# Patient Record
Sex: Male | Born: 1995
Health system: Southern US, Community
[De-identification: ages and names within clinical notes are randomized; demographics above are authoritative.]

---
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
## 2017-11-22 DIAGNOSIS — S76311D Strain of muscle, fascia and tendon of the posterior muscle group at thigh level, right thigh, subsequent encounter: Secondary | ICD-10-CM | POA: Diagnosis not present

## 2017-11-26 DIAGNOSIS — S76311D Strain of muscle, fascia and tendon of the posterior muscle group at thigh level, right thigh, subsequent encounter: Secondary | ICD-10-CM | POA: Diagnosis not present

## 2017-11-27 DIAGNOSIS — S76311D Strain of muscle, fascia and tendon of the posterior muscle group at thigh level, right thigh, subsequent encounter: Secondary | ICD-10-CM | POA: Diagnosis not present

## 2017-11-28 DIAGNOSIS — S76311D Strain of muscle, fascia and tendon of the posterior muscle group at thigh level, right thigh, subsequent encounter: Secondary | ICD-10-CM | POA: Diagnosis not present

## 2017-11-29 DIAGNOSIS — S76311D Strain of muscle, fascia and tendon of the posterior muscle group at thigh level, right thigh, subsequent encounter: Secondary | ICD-10-CM | POA: Diagnosis not present

## 2017-12-09 DIAGNOSIS — S76311D Strain of muscle, fascia and tendon of the posterior muscle group at thigh level, right thigh, subsequent encounter: Secondary | ICD-10-CM | POA: Diagnosis not present

## 2017-12-11 DIAGNOSIS — S76311D Strain of muscle, fascia and tendon of the posterior muscle group at thigh level, right thigh, subsequent encounter: Secondary | ICD-10-CM | POA: Diagnosis not present

## 2017-12-12 DIAGNOSIS — S76311D Strain of muscle, fascia and tendon of the posterior muscle group at thigh level, right thigh, subsequent encounter: Secondary | ICD-10-CM | POA: Diagnosis not present

## 2017-12-13 DIAGNOSIS — S76311D Strain of muscle, fascia and tendon of the posterior muscle group at thigh level, right thigh, subsequent encounter: Secondary | ICD-10-CM | POA: Diagnosis not present

## 2017-12-17 DIAGNOSIS — S76311D Strain of muscle, fascia and tendon of the posterior muscle group at thigh level, right thigh, subsequent encounter: Secondary | ICD-10-CM | POA: Diagnosis not present

## 2017-12-18 DIAGNOSIS — S76311D Strain of muscle, fascia and tendon of the posterior muscle group at thigh level, right thigh, subsequent encounter: Secondary | ICD-10-CM | POA: Diagnosis not present

## 2017-12-19 DIAGNOSIS — S76311D Strain of muscle, fascia and tendon of the posterior muscle group at thigh level, right thigh, subsequent encounter: Secondary | ICD-10-CM | POA: Diagnosis not present

## 2017-12-21 DIAGNOSIS — S76311D Strain of muscle, fascia and tendon of the posterior muscle group at thigh level, right thigh, subsequent encounter: Secondary | ICD-10-CM | POA: Diagnosis not present

## 2018-01-02 DIAGNOSIS — S76311D Strain of muscle, fascia and tendon of the posterior muscle group at thigh level, right thigh, subsequent encounter: Secondary | ICD-10-CM | POA: Diagnosis not present

## 2018-01-03 DIAGNOSIS — S76311D Strain of muscle, fascia and tendon of the posterior muscle group at thigh level, right thigh, subsequent encounter: Secondary | ICD-10-CM | POA: Diagnosis not present

## 2018-02-04 DIAGNOSIS — H52203 Unspecified astigmatism, bilateral: Secondary | ICD-10-CM | POA: Diagnosis not present

## 2018-03-11 DIAGNOSIS — M25511 Pain in right shoulder: Secondary | ICD-10-CM | POA: Diagnosis not present

## 2018-04-28 DIAGNOSIS — S43025A Posterior dislocation of left humerus, initial encounter: Secondary | ICD-10-CM | POA: Diagnosis not present

## 2018-05-08 DIAGNOSIS — S43025D Posterior dislocation of left humerus, subsequent encounter: Secondary | ICD-10-CM | POA: Diagnosis not present

## 2018-05-09 DIAGNOSIS — S43025D Posterior dislocation of left humerus, subsequent encounter: Secondary | ICD-10-CM | POA: Diagnosis not present

## 2018-05-10 DIAGNOSIS — S43025D Posterior dislocation of left humerus, subsequent encounter: Secondary | ICD-10-CM | POA: Diagnosis not present

## 2018-05-13 DIAGNOSIS — S43025D Posterior dislocation of left humerus, subsequent encounter: Secondary | ICD-10-CM | POA: Diagnosis not present

## 2018-05-14 DIAGNOSIS — S43025D Posterior dislocation of left humerus, subsequent encounter: Secondary | ICD-10-CM | POA: Diagnosis not present

## 2018-05-27 DIAGNOSIS — S43025D Posterior dislocation of left humerus, subsequent encounter: Secondary | ICD-10-CM | POA: Diagnosis not present

## 2018-05-27 DIAGNOSIS — M7631 Iliotibial band syndrome, right leg: Secondary | ICD-10-CM | POA: Diagnosis not present

## 2018-05-28 DIAGNOSIS — S43025D Posterior dislocation of left humerus, subsequent encounter: Secondary | ICD-10-CM | POA: Diagnosis not present

## 2018-05-29 DIAGNOSIS — M7631 Iliotibial band syndrome, right leg: Secondary | ICD-10-CM | POA: Diagnosis not present

## 2018-06-01 DIAGNOSIS — M7631 Iliotibial band syndrome, right leg: Secondary | ICD-10-CM | POA: Diagnosis not present

## 2018-06-02 ENCOUNTER — Ambulatory Visit
Admission: RE | Admit: 2018-06-02 | Discharge: 2018-06-02 | Disposition: A | Discharge status: Home / Self Care | Payer: BLUE CROSS/BLUE SHIELD | Source: Ambulatory Visit | Attending: Family Medicine | Admitting: Family Medicine

## 2018-06-02 ENCOUNTER — Other Ambulatory Visit: Payer: Self-pay | Admitting: Family Medicine

## 2018-06-02 DIAGNOSIS — R52 Pain, unspecified: Secondary | ICD-10-CM

## 2018-06-02 DIAGNOSIS — M25561 Pain in right knee: Secondary | ICD-10-CM | POA: Insufficient documentation

## 2018-06-02 DIAGNOSIS — R609 Edema, unspecified: Secondary | ICD-10-CM

## 2018-06-02 DIAGNOSIS — M7989 Other specified soft tissue disorders: Secondary | ICD-10-CM | POA: Insufficient documentation

## 2018-06-04 DIAGNOSIS — M7631 Iliotibial band syndrome, right leg: Secondary | ICD-10-CM | POA: Diagnosis not present

## 2018-06-05 DIAGNOSIS — M7631 Iliotibial band syndrome, right leg: Secondary | ICD-10-CM | POA: Diagnosis not present

## 2018-06-08 DIAGNOSIS — S43025D Posterior dislocation of left humerus, subsequent encounter: Secondary | ICD-10-CM | POA: Diagnosis not present

## 2018-06-09 DIAGNOSIS — S43025D Posterior dislocation of left humerus, subsequent encounter: Secondary | ICD-10-CM | POA: Diagnosis not present

## 2018-06-10 DIAGNOSIS — S43025D Posterior dislocation of left humerus, subsequent encounter: Secondary | ICD-10-CM | POA: Diagnosis not present

## 2018-06-11 DIAGNOSIS — M7631 Iliotibial band syndrome, right leg: Secondary | ICD-10-CM | POA: Diagnosis not present

## 2018-06-11 DIAGNOSIS — S43025D Posterior dislocation of left humerus, subsequent encounter: Secondary | ICD-10-CM | POA: Diagnosis not present

## 2018-06-15 DIAGNOSIS — S43025D Posterior dislocation of left humerus, subsequent encounter: Secondary | ICD-10-CM | POA: Diagnosis not present

## 2018-06-16 DIAGNOSIS — S43025D Posterior dislocation of left humerus, subsequent encounter: Secondary | ICD-10-CM | POA: Diagnosis not present

## 2018-06-18 DIAGNOSIS — S43025D Posterior dislocation of left humerus, subsequent encounter: Secondary | ICD-10-CM | POA: Diagnosis not present

## 2018-07-28 DIAGNOSIS — S43025D Posterior dislocation of left humerus, subsequent encounter: Secondary | ICD-10-CM | POA: Diagnosis not present

## 2018-08-13 ENCOUNTER — Other Ambulatory Visit: Payer: Self-pay | Admitting: Family Medicine

## 2018-08-13 ENCOUNTER — Ambulatory Visit
Admission: RE | Admit: 2018-08-13 | Discharge: 2018-08-13 | Disposition: A | Discharge status: Home / Self Care | Payer: BLUE CROSS/BLUE SHIELD | Source: Ambulatory Visit | Attending: Family Medicine | Admitting: Family Medicine

## 2018-08-13 DIAGNOSIS — R52 Pain, unspecified: Secondary | ICD-10-CM

## 2018-08-13 DIAGNOSIS — M25511 Pain in right shoulder: Secondary | ICD-10-CM | POA: Insufficient documentation

## 2018-08-13 DIAGNOSIS — S43004A Unspecified dislocation of right shoulder joint, initial encounter: Secondary | ICD-10-CM | POA: Diagnosis not present

## 2018-08-15 ENCOUNTER — Ambulatory Visit (INDEPENDENT_AMBULATORY_CARE_PROVIDER_SITE_OTHER): Payer: PRIVATE HEALTH INSURANCE | Admitting: Family Medicine

## 2018-08-15 ENCOUNTER — Encounter: Payer: Self-pay | Admitting: Family Medicine

## 2018-08-15 DIAGNOSIS — M25511 Pain in right shoulder: Secondary | ICD-10-CM

## 2018-08-15 NOTE — Progress Notes (Signed)
Patient presents today with history of right shoulder subluxation.  Patient states that the first time was during spring football. It happened again twice over the football season.  He did wear a sully which he states helped. Yesterday he had a subluxation event while in bed rolling over. He states most of his soreness is posterior.  He denies having events similar to this in high school.  He is unsure whether he had one subluxation event during the season to his left shoulder.  The athletic trainers said it was an [facility].  He denies any problems in the weight room.  He rates his pain a 1 out of 10 today.  ROS: Negative except mentioned above. Vitals as per Epic. GENERAL: NAD MSK: R Shoulder -no gross abnormality, mild tenderness posteriorly, full range of motion, negative lift off, negative empty can, negative O'Brien's, positive apprehension NEURO: CN II-XII grossly intact   A/P: Right shoulder instability - x-rays were done, will proceed with doing MRI, patient is to see Dr. Ardine Engiehl next week, seek medical attention if any acute problems.

## 2018-08-19 ENCOUNTER — Ambulatory Visit
Admission: RE | Admit: 2018-08-19 | Discharge: 2018-08-19 | Disposition: A | Discharge status: Home / Self Care | Payer: BLUE CROSS/BLUE SHIELD | Source: Ambulatory Visit | Attending: Family Medicine | Admitting: Family Medicine

## 2018-08-19 DIAGNOSIS — M75111 Incomplete rotator cuff tear or rupture of right shoulder, not specified as traumatic: Secondary | ICD-10-CM | POA: Insufficient documentation

## 2018-08-19 DIAGNOSIS — R609 Edema, unspecified: Secondary | ICD-10-CM | POA: Insufficient documentation

## 2018-08-19 DIAGNOSIS — S43006A Unspecified dislocation of unspecified shoulder joint, initial encounter: Secondary | ICD-10-CM

## 2018-08-19 DIAGNOSIS — X58XXXA Exposure to other specified factors, initial encounter: Secondary | ICD-10-CM | POA: Diagnosis not present

## 2018-08-19 DIAGNOSIS — M25311 Other instability, right shoulder: Secondary | ICD-10-CM | POA: Diagnosis not present

## 2018-08-19 DIAGNOSIS — M25511 Pain in right shoulder: Secondary | ICD-10-CM | POA: Diagnosis not present

## 2018-09-18 DIAGNOSIS — M25319 Other instability, unspecified shoulder: Secondary | ICD-10-CM | POA: Diagnosis not present

## 2018-09-22 DIAGNOSIS — M25311 Other instability, right shoulder: Secondary | ICD-10-CM | POA: Diagnosis not present

## 2018-09-22 DIAGNOSIS — S43431A Superior glenoid labrum lesion of right shoulder, initial encounter: Secondary | ICD-10-CM | POA: Diagnosis not present

## 2018-09-22 DIAGNOSIS — W1839XA Other fall on same level, initial encounter: Secondary | ICD-10-CM | POA: Diagnosis not present

## 2018-09-22 DIAGNOSIS — G8918 Other acute postprocedural pain: Secondary | ICD-10-CM | POA: Diagnosis not present

## 2018-09-22 DIAGNOSIS — M25511 Pain in right shoulder: Secondary | ICD-10-CM | POA: Diagnosis not present

## 2018-09-22 DIAGNOSIS — M65821 Other synovitis and tenosynovitis, right upper arm: Secondary | ICD-10-CM | POA: Diagnosis not present

## 2018-09-25 DIAGNOSIS — S43025D Posterior dislocation of left humerus, subsequent encounter: Secondary | ICD-10-CM | POA: Diagnosis not present

## 2018-09-26 DIAGNOSIS — S43025D Posterior dislocation of left humerus, subsequent encounter: Secondary | ICD-10-CM | POA: Diagnosis not present

## 2018-10-01 DIAGNOSIS — S43025D Posterior dislocation of left humerus, subsequent encounter: Secondary | ICD-10-CM | POA: Diagnosis not present

## 2018-10-02 DIAGNOSIS — S43025D Posterior dislocation of left humerus, subsequent encounter: Secondary | ICD-10-CM | POA: Diagnosis not present

## 2018-10-03 DIAGNOSIS — S43025D Posterior dislocation of left humerus, subsequent encounter: Secondary | ICD-10-CM | POA: Diagnosis not present

## 2018-10-14 DIAGNOSIS — S43025D Posterior dislocation of left humerus, subsequent encounter: Secondary | ICD-10-CM | POA: Diagnosis not present

## 2018-10-15 DIAGNOSIS — S43025D Posterior dislocation of left humerus, subsequent encounter: Secondary | ICD-10-CM | POA: Diagnosis not present

## 2018-10-16 DIAGNOSIS — S43025D Posterior dislocation of left humerus, subsequent encounter: Secondary | ICD-10-CM | POA: Diagnosis not present

## 2018-10-17 DIAGNOSIS — S43025D Posterior dislocation of left humerus, subsequent encounter: Secondary | ICD-10-CM | POA: Diagnosis not present

## 2018-10-20 DIAGNOSIS — S43025D Posterior dislocation of left humerus, subsequent encounter: Secondary | ICD-10-CM | POA: Diagnosis not present

## 2018-10-21 DIAGNOSIS — S43025D Posterior dislocation of left humerus, subsequent encounter: Secondary | ICD-10-CM | POA: Diagnosis not present

## 2018-10-22 DIAGNOSIS — S43025D Posterior dislocation of left humerus, subsequent encounter: Secondary | ICD-10-CM | POA: Diagnosis not present

## 2018-10-23 DIAGNOSIS — S43025D Posterior dislocation of left humerus, subsequent encounter: Secondary | ICD-10-CM | POA: Diagnosis not present

## 2018-10-24 DIAGNOSIS — S43025D Posterior dislocation of left humerus, subsequent encounter: Secondary | ICD-10-CM | POA: Diagnosis not present

## 2018-10-27 DIAGNOSIS — S43025D Posterior dislocation of left humerus, subsequent encounter: Secondary | ICD-10-CM | POA: Diagnosis not present

## 2018-10-28 DIAGNOSIS — S43025D Posterior dislocation of left humerus, subsequent encounter: Secondary | ICD-10-CM | POA: Diagnosis not present

## 2018-10-29 DIAGNOSIS — S43025D Posterior dislocation of left humerus, subsequent encounter: Secondary | ICD-10-CM | POA: Diagnosis not present

## 2018-10-30 DIAGNOSIS — S43025D Posterior dislocation of left humerus, subsequent encounter: Secondary | ICD-10-CM | POA: Diagnosis not present

## 2018-10-31 DIAGNOSIS — S43025D Posterior dislocation of left humerus, subsequent encounter: Secondary | ICD-10-CM | POA: Diagnosis not present

## 2018-11-03 DIAGNOSIS — S43025D Posterior dislocation of left humerus, subsequent encounter: Secondary | ICD-10-CM | POA: Diagnosis not present

## 2018-11-04 DIAGNOSIS — S43025D Posterior dislocation of left humerus, subsequent encounter: Secondary | ICD-10-CM | POA: Diagnosis not present

## 2018-11-05 DIAGNOSIS — S43025D Posterior dislocation of left humerus, subsequent encounter: Secondary | ICD-10-CM | POA: Diagnosis not present

## 2018-11-06 DIAGNOSIS — S43025D Posterior dislocation of left humerus, subsequent encounter: Secondary | ICD-10-CM | POA: Diagnosis not present

## 2018-11-07 DIAGNOSIS — S43025D Posterior dislocation of left humerus, subsequent encounter: Secondary | ICD-10-CM | POA: Diagnosis not present

## 2018-11-10 DIAGNOSIS — S43025D Posterior dislocation of left humerus, subsequent encounter: Secondary | ICD-10-CM | POA: Diagnosis not present

## 2018-11-11 DIAGNOSIS — S43025D Posterior dislocation of left humerus, subsequent encounter: Secondary | ICD-10-CM | POA: Diagnosis not present

## 2018-11-11 DIAGNOSIS — S43431D Superior glenoid labrum lesion of right shoulder, subsequent encounter: Secondary | ICD-10-CM | POA: Diagnosis not present

## 2018-11-12 DIAGNOSIS — S43025D Posterior dislocation of left humerus, subsequent encounter: Secondary | ICD-10-CM | POA: Diagnosis not present

## 2018-11-13 DIAGNOSIS — S43025D Posterior dislocation of left humerus, subsequent encounter: Secondary | ICD-10-CM | POA: Diagnosis not present

## 2018-11-15 DIAGNOSIS — S43025D Posterior dislocation of left humerus, subsequent encounter: Secondary | ICD-10-CM | POA: Diagnosis not present

## 2019-02-04 ENCOUNTER — Other Ambulatory Visit: Payer: Self-pay | Admitting: Hematology

## 2019-02-04 DIAGNOSIS — Z20822 Contact with and (suspected) exposure to covid-19: Secondary | ICD-10-CM

## 2019-02-04 NOTE — Progress Notes (Signed)
COVID screening order.  Please send results to Dr. Jolene Provost (listed as PCP).  Patient is a Consulting civil engineer at [employer]

## 2019-02-05 ENCOUNTER — Other Ambulatory Visit: Payer: PRIVATE HEALTH INSURANCE

## 2019-02-06 ENCOUNTER — Other Ambulatory Visit: Payer: Self-pay

## 2019-02-06 DIAGNOSIS — Z20822 Contact with and (suspected) exposure to covid-19: Secondary | ICD-10-CM

## 2019-02-08 LAB — NOVEL CORONAVIRUS, NAA: SARS-CoV-2, NAA: NOT DETECTED

## 2019-02-09 DIAGNOSIS — S43025D Posterior dislocation of left humerus, subsequent encounter: Secondary | ICD-10-CM | POA: Diagnosis not present

## 2019-02-10 DIAGNOSIS — S43025D Posterior dislocation of left humerus, subsequent encounter: Secondary | ICD-10-CM | POA: Diagnosis not present

## 2019-02-17 DIAGNOSIS — S43025D Posterior dislocation of left humerus, subsequent encounter: Secondary | ICD-10-CM | POA: Diagnosis not present

## 2019-02-19 DIAGNOSIS — S43025D Posterior dislocation of left humerus, subsequent encounter: Secondary | ICD-10-CM | POA: Diagnosis not present

## 2019-02-23 DIAGNOSIS — S43025D Posterior dislocation of left humerus, subsequent encounter: Secondary | ICD-10-CM | POA: Diagnosis not present

## 2019-02-25 DIAGNOSIS — S43025D Posterior dislocation of left humerus, subsequent encounter: Secondary | ICD-10-CM | POA: Diagnosis not present

## 2019-02-26 DIAGNOSIS — S43025D Posterior dislocation of left humerus, subsequent encounter: Secondary | ICD-10-CM | POA: Diagnosis not present

## 2019-02-27 DIAGNOSIS — S43025D Posterior dislocation of left humerus, subsequent encounter: Secondary | ICD-10-CM | POA: Diagnosis not present

## 2019-03-04 DIAGNOSIS — S43025D Posterior dislocation of left humerus, subsequent encounter: Secondary | ICD-10-CM | POA: Diagnosis not present

## 2019-03-05 DIAGNOSIS — S43025D Posterior dislocation of left humerus, subsequent encounter: Secondary | ICD-10-CM | POA: Diagnosis not present

## 2019-03-06 DIAGNOSIS — S43025D Posterior dislocation of left humerus, subsequent encounter: Secondary | ICD-10-CM | POA: Diagnosis not present

## 2019-03-12 ENCOUNTER — Other Ambulatory Visit: Payer: Self-pay | Admitting: *Deleted

## 2019-03-12 DIAGNOSIS — Z20822 Contact with and (suspected) exposure to covid-19: Secondary | ICD-10-CM

## 2019-03-16 ENCOUNTER — Other Ambulatory Visit: Payer: Self-pay

## 2019-03-16 DIAGNOSIS — Z20822 Contact with and (suspected) exposure to covid-19: Secondary | ICD-10-CM

## 2019-03-16 DIAGNOSIS — R6889 Other general symptoms and signs: Secondary | ICD-10-CM | POA: Diagnosis not present

## 2019-03-21 LAB — NOVEL CORONAVIRUS, NAA: SARS-CoV-2, NAA: NOT DETECTED

## 2019-03-25 DIAGNOSIS — S43025D Posterior dislocation of left humerus, subsequent encounter: Secondary | ICD-10-CM | POA: Diagnosis not present

## 2019-03-27 DIAGNOSIS — S43025D Posterior dislocation of left humerus, subsequent encounter: Secondary | ICD-10-CM | POA: Diagnosis not present

## 2019-04-02 DIAGNOSIS — S43025D Posterior dislocation of left humerus, subsequent encounter: Secondary | ICD-10-CM | POA: Diagnosis not present

## 2019-04-06 ENCOUNTER — Other Ambulatory Visit: Payer: Self-pay

## 2019-04-06 DIAGNOSIS — Z20822 Contact with and (suspected) exposure to covid-19: Secondary | ICD-10-CM

## 2019-04-07 DIAGNOSIS — R6889 Other general symptoms and signs: Secondary | ICD-10-CM | POA: Diagnosis not present

## 2019-04-08 DIAGNOSIS — S43025D Posterior dislocation of left humerus, subsequent encounter: Secondary | ICD-10-CM | POA: Diagnosis not present

## 2019-04-08 DIAGNOSIS — M25311 Other instability, right shoulder: Secondary | ICD-10-CM | POA: Diagnosis not present

## 2019-04-09 LAB — NOVEL CORONAVIRUS, NAA: SARS-CoV-2, NAA: NOT DETECTED

## 2019-04-10 DIAGNOSIS — S43025D Posterior dislocation of left humerus, subsequent encounter: Secondary | ICD-10-CM | POA: Diagnosis not present

## 2019-04-16 DIAGNOSIS — S43025D Posterior dislocation of left humerus, subsequent encounter: Secondary | ICD-10-CM | POA: Diagnosis not present

## 2019-04-21 DIAGNOSIS — S43025D Posterior dislocation of left humerus, subsequent encounter: Secondary | ICD-10-CM | POA: Diagnosis not present

## 2019-04-23 DIAGNOSIS — S43025D Posterior dislocation of left humerus, subsequent encounter: Secondary | ICD-10-CM | POA: Diagnosis not present

## 2019-04-27 DIAGNOSIS — S43025D Posterior dislocation of left humerus, subsequent encounter: Secondary | ICD-10-CM | POA: Diagnosis not present

## 2019-05-27 DIAGNOSIS — Z20828 Contact with and (suspected) exposure to other viral communicable diseases: Secondary | ICD-10-CM | POA: Diagnosis not present

## 2019-06-03 DIAGNOSIS — Z20828 Contact with and (suspected) exposure to other viral communicable diseases: Secondary | ICD-10-CM | POA: Diagnosis not present

## 2019-06-15 DIAGNOSIS — S43025D Posterior dislocation of left humerus, subsequent encounter: Secondary | ICD-10-CM | POA: Diagnosis not present

## 2019-06-25 DIAGNOSIS — S43025D Posterior dislocation of left humerus, subsequent encounter: Secondary | ICD-10-CM | POA: Diagnosis not present

## 2019-07-07 ENCOUNTER — Other Ambulatory Visit: Payer: Self-pay | Admitting: Family Medicine

## 2019-07-07 DIAGNOSIS — S43006A Unspecified dislocation of unspecified shoulder joint, initial encounter: Secondary | ICD-10-CM

## 2019-07-07 DIAGNOSIS — S43005A Unspecified dislocation of left shoulder joint, initial encounter: Secondary | ICD-10-CM

## 2019-07-08 ENCOUNTER — Other Ambulatory Visit: Payer: Self-pay

## 2019-07-08 ENCOUNTER — Ambulatory Visit
Admission: RE | Admit: 2019-07-08 | Discharge: 2019-07-08 | Disposition: A | Discharge status: Home / Self Care | Payer: BC Managed Care – PPO | Source: Ambulatory Visit | Attending: Family Medicine | Admitting: Family Medicine

## 2019-07-08 ENCOUNTER — Ambulatory Visit
Admission: RE | Admit: 2019-07-08 | Discharge: 2019-07-08 | Disposition: A | Discharge status: Home / Self Care | Payer: BC Managed Care – PPO | Attending: Family Medicine | Admitting: Family Medicine

## 2019-07-08 DIAGNOSIS — S43005A Unspecified dislocation of left shoulder joint, initial encounter: Secondary | ICD-10-CM | POA: Diagnosis not present

## 2019-07-08 DIAGNOSIS — Z20828 Contact with and (suspected) exposure to other viral communicable diseases: Secondary | ICD-10-CM | POA: Diagnosis not present

## 2019-07-08 DIAGNOSIS — S43015A Anterior dislocation of left humerus, initial encounter: Secondary | ICD-10-CM | POA: Diagnosis not present

## 2019-07-10 ENCOUNTER — Other Ambulatory Visit: Payer: Self-pay

## 2019-07-10 ENCOUNTER — Ambulatory Visit
Admission: RE | Admit: 2019-07-10 | Discharge: 2019-07-10 | Disposition: A | Discharge status: Home / Self Care | Payer: BC Managed Care – PPO | Source: Ambulatory Visit | Attending: Family Medicine | Admitting: Family Medicine

## 2019-07-10 DIAGNOSIS — S43006A Unspecified dislocation of unspecified shoulder joint, initial encounter: Secondary | ICD-10-CM | POA: Diagnosis not present

## 2019-07-10 DIAGNOSIS — S43005A Unspecified dislocation of left shoulder joint, initial encounter: Secondary | ICD-10-CM | POA: Diagnosis not present

## 2019-07-13 DIAGNOSIS — M25312 Other instability, left shoulder: Secondary | ICD-10-CM | POA: Diagnosis not present

## 2019-07-20 DIAGNOSIS — Z20828 Contact with and (suspected) exposure to other viral communicable diseases: Secondary | ICD-10-CM | POA: Diagnosis not present

## 2019-07-23 DIAGNOSIS — M948X1 Other specified disorders of cartilage, shoulder: Secondary | ICD-10-CM | POA: Diagnosis not present

## 2019-07-23 DIAGNOSIS — G8918 Other acute postprocedural pain: Secondary | ICD-10-CM | POA: Diagnosis not present

## 2019-07-23 DIAGNOSIS — M25312 Other instability, left shoulder: Secondary | ICD-10-CM | POA: Diagnosis not present

## 2019-07-23 DIAGNOSIS — M25512 Pain in left shoulder: Secondary | ICD-10-CM | POA: Diagnosis not present

## 2019-07-23 DIAGNOSIS — M75112 Incomplete rotator cuff tear or rupture of left shoulder, not specified as traumatic: Secondary | ICD-10-CM | POA: Diagnosis not present

## 2019-07-23 DIAGNOSIS — M24012 Loose body in left shoulder: Secondary | ICD-10-CM | POA: Diagnosis not present

## 2019-07-23 DIAGNOSIS — M24112 Other articular cartilage disorders, left shoulder: Secondary | ICD-10-CM | POA: Diagnosis not present

## 2019-07-27 DIAGNOSIS — S43432D Superior glenoid labrum lesion of left shoulder, subsequent encounter: Secondary | ICD-10-CM | POA: Diagnosis not present

## 2019-07-27 DIAGNOSIS — S43025D Posterior dislocation of left humerus, subsequent encounter: Secondary | ICD-10-CM | POA: Diagnosis not present

## 2019-07-28 DIAGNOSIS — S43025D Posterior dislocation of left humerus, subsequent encounter: Secondary | ICD-10-CM | POA: Diagnosis not present

## 2019-07-30 DIAGNOSIS — S43025D Posterior dislocation of left humerus, subsequent encounter: Secondary | ICD-10-CM | POA: Diagnosis not present

## 2019-08-10 DIAGNOSIS — S43025D Posterior dislocation of left humerus, subsequent encounter: Secondary | ICD-10-CM | POA: Diagnosis not present

## 2019-08-11 DIAGNOSIS — S43025D Posterior dislocation of left humerus, subsequent encounter: Secondary | ICD-10-CM | POA: Diagnosis not present

## 2019-08-12 DIAGNOSIS — S43025D Posterior dislocation of left humerus, subsequent encounter: Secondary | ICD-10-CM | POA: Diagnosis not present

## 2019-08-13 DIAGNOSIS — S43025D Posterior dislocation of left humerus, subsequent encounter: Secondary | ICD-10-CM | POA: Diagnosis not present

## 2019-08-17 DIAGNOSIS — S43025D Posterior dislocation of left humerus, subsequent encounter: Secondary | ICD-10-CM | POA: Diagnosis not present

## 2019-08-18 DIAGNOSIS — S43025D Posterior dislocation of left humerus, subsequent encounter: Secondary | ICD-10-CM | POA: Diagnosis not present

## 2019-08-20 DIAGNOSIS — S43025D Posterior dislocation of left humerus, subsequent encounter: Secondary | ICD-10-CM | POA: Diagnosis not present

## 2019-08-21 DIAGNOSIS — S43025D Posterior dislocation of left humerus, subsequent encounter: Secondary | ICD-10-CM | POA: Diagnosis not present

## 2019-08-24 DIAGNOSIS — S43025D Posterior dislocation of left humerus, subsequent encounter: Secondary | ICD-10-CM | POA: Diagnosis not present

## 2019-09-05 DIAGNOSIS — Z20828 Contact with and (suspected) exposure to other viral communicable diseases: Secondary | ICD-10-CM | POA: Diagnosis not present

## 2019-09-05 DIAGNOSIS — J039 Acute tonsillitis, unspecified: Secondary | ICD-10-CM | POA: Diagnosis not present

## 2019-09-05 DIAGNOSIS — J029 Acute pharyngitis, unspecified: Secondary | ICD-10-CM | POA: Diagnosis not present

## 2019-09-16 DIAGNOSIS — Z20822 Contact with and (suspected) exposure to covid-19: Secondary | ICD-10-CM | POA: Diagnosis not present

## 2019-09-17 DIAGNOSIS — S43025D Posterior dislocation of left humerus, subsequent encounter: Secondary | ICD-10-CM | POA: Diagnosis not present

## 2019-09-18 DIAGNOSIS — S43025D Posterior dislocation of left humerus, subsequent encounter: Secondary | ICD-10-CM | POA: Diagnosis not present

## 2019-09-19 DIAGNOSIS — S43025D Posterior dislocation of left humerus, subsequent encounter: Secondary | ICD-10-CM | POA: Diagnosis not present

## 2019-09-20 DIAGNOSIS — Z20822 Contact with and (suspected) exposure to covid-19: Secondary | ICD-10-CM | POA: Diagnosis not present

## 2019-09-21 DIAGNOSIS — S43025D Posterior dislocation of left humerus, subsequent encounter: Secondary | ICD-10-CM | POA: Diagnosis not present

## 2019-09-23 DIAGNOSIS — S43025D Posterior dislocation of left humerus, subsequent encounter: Secondary | ICD-10-CM | POA: Diagnosis not present

## 2019-09-24 DIAGNOSIS — S43025D Posterior dislocation of left humerus, subsequent encounter: Secondary | ICD-10-CM | POA: Diagnosis not present

## 2019-09-29 DIAGNOSIS — S43025D Posterior dislocation of left humerus, subsequent encounter: Secondary | ICD-10-CM | POA: Diagnosis not present

## 2019-10-02 DIAGNOSIS — Z20822 Contact with and (suspected) exposure to covid-19: Secondary | ICD-10-CM | POA: Diagnosis not present

## 2019-10-04 DIAGNOSIS — Z20822 Contact with and (suspected) exposure to covid-19: Secondary | ICD-10-CM | POA: Diagnosis not present

## 2019-10-06 DIAGNOSIS — S43025D Posterior dislocation of left humerus, subsequent encounter: Secondary | ICD-10-CM | POA: Diagnosis not present

## 2019-10-06 DIAGNOSIS — Z20822 Contact with and (suspected) exposure to covid-19: Secondary | ICD-10-CM | POA: Diagnosis not present

## 2019-10-07 DIAGNOSIS — S43025D Posterior dislocation of left humerus, subsequent encounter: Secondary | ICD-10-CM | POA: Diagnosis not present

## 2019-10-08 DIAGNOSIS — Z20822 Contact with and (suspected) exposure to covid-19: Secondary | ICD-10-CM | POA: Diagnosis not present

## 2019-10-10 DIAGNOSIS — S43025D Posterior dislocation of left humerus, subsequent encounter: Secondary | ICD-10-CM | POA: Diagnosis not present

## 2019-10-11 DIAGNOSIS — Z20822 Contact with and (suspected) exposure to covid-19: Secondary | ICD-10-CM | POA: Diagnosis not present

## 2019-10-13 DIAGNOSIS — S43025D Posterior dislocation of left humerus, subsequent encounter: Secondary | ICD-10-CM | POA: Diagnosis not present

## 2019-10-13 DIAGNOSIS — Z20822 Contact with and (suspected) exposure to covid-19: Secondary | ICD-10-CM | POA: Diagnosis not present

## 2019-10-14 DIAGNOSIS — S43025D Posterior dislocation of left humerus, subsequent encounter: Secondary | ICD-10-CM | POA: Diagnosis not present

## 2019-10-15 DIAGNOSIS — Z20822 Contact with and (suspected) exposure to covid-19: Secondary | ICD-10-CM | POA: Diagnosis not present

## 2019-10-15 DIAGNOSIS — S43025D Posterior dislocation of left humerus, subsequent encounter: Secondary | ICD-10-CM | POA: Diagnosis not present

## 2019-10-16 DIAGNOSIS — S43025D Posterior dislocation of left humerus, subsequent encounter: Secondary | ICD-10-CM | POA: Diagnosis not present

## 2019-10-18 DIAGNOSIS — S43025D Posterior dislocation of left humerus, subsequent encounter: Secondary | ICD-10-CM | POA: Diagnosis not present

## 2019-10-18 DIAGNOSIS — Z20822 Contact with and (suspected) exposure to covid-19: Secondary | ICD-10-CM | POA: Diagnosis not present

## 2019-10-19 DIAGNOSIS — S43025D Posterior dislocation of left humerus, subsequent encounter: Secondary | ICD-10-CM | POA: Diagnosis not present

## 2019-10-20 DIAGNOSIS — Z20822 Contact with and (suspected) exposure to covid-19: Secondary | ICD-10-CM | POA: Diagnosis not present

## 2019-10-20 DIAGNOSIS — S43025D Posterior dislocation of left humerus, subsequent encounter: Secondary | ICD-10-CM | POA: Diagnosis not present

## 2019-10-22 DIAGNOSIS — Z20822 Contact with and (suspected) exposure to covid-19: Secondary | ICD-10-CM | POA: Diagnosis not present

## 2019-10-22 DIAGNOSIS — S43025D Posterior dislocation of left humerus, subsequent encounter: Secondary | ICD-10-CM | POA: Diagnosis not present

## 2019-10-25 DIAGNOSIS — Z20822 Contact with and (suspected) exposure to covid-19: Secondary | ICD-10-CM | POA: Diagnosis not present

## 2019-10-25 DIAGNOSIS — S43025D Posterior dislocation of left humerus, subsequent encounter: Secondary | ICD-10-CM | POA: Diagnosis not present

## 2019-10-26 DIAGNOSIS — S43025D Posterior dislocation of left humerus, subsequent encounter: Secondary | ICD-10-CM | POA: Diagnosis not present

## 2019-10-27 DIAGNOSIS — Z20822 Contact with and (suspected) exposure to covid-19: Secondary | ICD-10-CM | POA: Diagnosis not present

## 2019-10-28 DIAGNOSIS — S43025D Posterior dislocation of left humerus, subsequent encounter: Secondary | ICD-10-CM | POA: Diagnosis not present

## 2019-10-30 DIAGNOSIS — Z20822 Contact with and (suspected) exposure to covid-19: Secondary | ICD-10-CM | POA: Diagnosis not present

## 2019-11-01 DIAGNOSIS — Z20822 Contact with and (suspected) exposure to covid-19: Secondary | ICD-10-CM | POA: Diagnosis not present

## 2019-11-01 DIAGNOSIS — S43025D Posterior dislocation of left humerus, subsequent encounter: Secondary | ICD-10-CM | POA: Diagnosis not present

## 2019-11-02 DIAGNOSIS — M25312 Other instability, left shoulder: Secondary | ICD-10-CM | POA: Diagnosis not present

## 2019-11-02 DIAGNOSIS — S43025D Posterior dislocation of left humerus, subsequent encounter: Secondary | ICD-10-CM | POA: Diagnosis not present

## 2019-11-03 DIAGNOSIS — Z20822 Contact with and (suspected) exposure to covid-19: Secondary | ICD-10-CM | POA: Diagnosis not present

## 2019-11-03 DIAGNOSIS — S43025D Posterior dislocation of left humerus, subsequent encounter: Secondary | ICD-10-CM | POA: Diagnosis not present

## 2019-11-04 DIAGNOSIS — S43025D Posterior dislocation of left humerus, subsequent encounter: Secondary | ICD-10-CM | POA: Diagnosis not present

## 2019-11-05 DIAGNOSIS — S43025D Posterior dislocation of left humerus, subsequent encounter: Secondary | ICD-10-CM | POA: Diagnosis not present

## 2019-11-06 DIAGNOSIS — Z20822 Contact with and (suspected) exposure to covid-19: Secondary | ICD-10-CM | POA: Diagnosis not present

## 2019-11-08 DIAGNOSIS — Z20822 Contact with and (suspected) exposure to covid-19: Secondary | ICD-10-CM | POA: Diagnosis not present

## 2019-11-08 DIAGNOSIS — S43025D Posterior dislocation of left humerus, subsequent encounter: Secondary | ICD-10-CM | POA: Diagnosis not present

## 2019-11-09 DIAGNOSIS — S43025D Posterior dislocation of left humerus, subsequent encounter: Secondary | ICD-10-CM | POA: Diagnosis not present

## 2019-11-10 DIAGNOSIS — Z20822 Contact with and (suspected) exposure to covid-19: Secondary | ICD-10-CM | POA: Diagnosis not present

## 2019-11-11 DIAGNOSIS — S43025D Posterior dislocation of left humerus, subsequent encounter: Secondary | ICD-10-CM | POA: Diagnosis not present

## 2019-11-12 DIAGNOSIS — S43025D Posterior dislocation of left humerus, subsequent encounter: Secondary | ICD-10-CM | POA: Diagnosis not present

## 2019-11-12 DIAGNOSIS — Z20822 Contact with and (suspected) exposure to covid-19: Secondary | ICD-10-CM | POA: Diagnosis not present

## 2019-11-15 DIAGNOSIS — Z20822 Contact with and (suspected) exposure to covid-19: Secondary | ICD-10-CM | POA: Diagnosis not present

## 2019-11-16 DIAGNOSIS — S43025D Posterior dislocation of left humerus, subsequent encounter: Secondary | ICD-10-CM | POA: Diagnosis not present

## 2019-11-17 DIAGNOSIS — Z20822 Contact with and (suspected) exposure to covid-19: Secondary | ICD-10-CM | POA: Diagnosis not present

## 2019-11-17 DIAGNOSIS — S43025D Posterior dislocation of left humerus, subsequent encounter: Secondary | ICD-10-CM | POA: Diagnosis not present

## 2019-11-18 DIAGNOSIS — S43025D Posterior dislocation of left humerus, subsequent encounter: Secondary | ICD-10-CM | POA: Diagnosis not present

## 2019-11-19 DIAGNOSIS — S43025D Posterior dislocation of left humerus, subsequent encounter: Secondary | ICD-10-CM | POA: Diagnosis not present

## 2019-11-22 DIAGNOSIS — Z20822 Contact with and (suspected) exposure to covid-19: Secondary | ICD-10-CM | POA: Diagnosis not present

## 2019-11-26 DIAGNOSIS — S43025D Posterior dislocation of left humerus, subsequent encounter: Secondary | ICD-10-CM | POA: Diagnosis not present

## 2019-11-26 DIAGNOSIS — Z20822 Contact with and (suspected) exposure to covid-19: Secondary | ICD-10-CM | POA: Diagnosis not present

## 2019-11-27 DIAGNOSIS — Z20822 Contact with and (suspected) exposure to covid-19: Secondary | ICD-10-CM | POA: Diagnosis not present

## 2019-11-27 DIAGNOSIS — S43025D Posterior dislocation of left humerus, subsequent encounter: Secondary | ICD-10-CM | POA: Diagnosis not present

## 2019-11-29 DIAGNOSIS — S43025D Posterior dislocation of left humerus, subsequent encounter: Secondary | ICD-10-CM | POA: Diagnosis not present

## 2019-11-29 DIAGNOSIS — Z20822 Contact with and (suspected) exposure to covid-19: Secondary | ICD-10-CM | POA: Diagnosis not present

## 2019-11-30 DIAGNOSIS — S43025D Posterior dislocation of left humerus, subsequent encounter: Secondary | ICD-10-CM | POA: Diagnosis not present

## 2019-12-01 DIAGNOSIS — S43025D Posterior dislocation of left humerus, subsequent encounter: Secondary | ICD-10-CM | POA: Diagnosis not present

## 2019-12-01 DIAGNOSIS — Z20822 Contact with and (suspected) exposure to covid-19: Secondary | ICD-10-CM | POA: Diagnosis not present

## 2019-12-02 DIAGNOSIS — S43025D Posterior dislocation of left humerus, subsequent encounter: Secondary | ICD-10-CM | POA: Diagnosis not present

## 2019-12-03 DIAGNOSIS — S43025D Posterior dislocation of left humerus, subsequent encounter: Secondary | ICD-10-CM | POA: Diagnosis not present

## 2019-12-04 DIAGNOSIS — Z20822 Contact with and (suspected) exposure to covid-19: Secondary | ICD-10-CM | POA: Diagnosis not present

## 2019-12-06 DIAGNOSIS — Z20822 Contact with and (suspected) exposure to covid-19: Secondary | ICD-10-CM | POA: Diagnosis not present

## 2019-12-07 DIAGNOSIS — S43025D Posterior dislocation of left humerus, subsequent encounter: Secondary | ICD-10-CM | POA: Diagnosis not present

## 2019-12-08 DIAGNOSIS — Z20822 Contact with and (suspected) exposure to covid-19: Secondary | ICD-10-CM | POA: Diagnosis not present

## 2019-12-08 DIAGNOSIS — S43025D Posterior dislocation of left humerus, subsequent encounter: Secondary | ICD-10-CM | POA: Diagnosis not present

## 2019-12-10 DIAGNOSIS — S43025D Posterior dislocation of left humerus, subsequent encounter: Secondary | ICD-10-CM | POA: Diagnosis not present

## 2019-12-10 DIAGNOSIS — Z20822 Contact with and (suspected) exposure to covid-19: Secondary | ICD-10-CM | POA: Diagnosis not present

## 2019-12-15 DIAGNOSIS — Z20822 Contact with and (suspected) exposure to covid-19: Secondary | ICD-10-CM | POA: Diagnosis not present

## 2019-12-17 DIAGNOSIS — S43025D Posterior dislocation of left humerus, subsequent encounter: Secondary | ICD-10-CM | POA: Diagnosis not present

## 2019-12-21 DIAGNOSIS — S43025D Posterior dislocation of left humerus, subsequent encounter: Secondary | ICD-10-CM | POA: Diagnosis not present

## 2019-12-21 DIAGNOSIS — Z20822 Contact with and (suspected) exposure to covid-19: Secondary | ICD-10-CM | POA: Diagnosis not present

## 2019-12-22 DIAGNOSIS — S43025D Posterior dislocation of left humerus, subsequent encounter: Secondary | ICD-10-CM | POA: Diagnosis not present

## 2019-12-23 DIAGNOSIS — S43025D Posterior dislocation of left humerus, subsequent encounter: Secondary | ICD-10-CM | POA: Diagnosis not present

## 2019-12-24 DIAGNOSIS — S43025D Posterior dislocation of left humerus, subsequent encounter: Secondary | ICD-10-CM | POA: Diagnosis not present

## 2019-12-28 DIAGNOSIS — S43025D Posterior dislocation of left humerus, subsequent encounter: Secondary | ICD-10-CM | POA: Diagnosis not present

## 2019-12-29 DIAGNOSIS — Z20822 Contact with and (suspected) exposure to covid-19: Secondary | ICD-10-CM | POA: Diagnosis not present

## 2019-12-31 DIAGNOSIS — S43025D Posterior dislocation of left humerus, subsequent encounter: Secondary | ICD-10-CM | POA: Diagnosis not present

## 2019-12-31 DIAGNOSIS — Z20822 Contact with and (suspected) exposure to covid-19: Secondary | ICD-10-CM | POA: Diagnosis not present

## 2020-01-01 DIAGNOSIS — S43025D Posterior dislocation of left humerus, subsequent encounter: Secondary | ICD-10-CM | POA: Diagnosis not present

## 2020-01-04 DIAGNOSIS — S43025D Posterior dislocation of left humerus, subsequent encounter: Secondary | ICD-10-CM | POA: Diagnosis not present

## 2020-01-05 DIAGNOSIS — Z20822 Contact with and (suspected) exposure to covid-19: Secondary | ICD-10-CM | POA: Diagnosis not present

## 2020-01-06 DIAGNOSIS — S43025D Posterior dislocation of left humerus, subsequent encounter: Secondary | ICD-10-CM | POA: Diagnosis not present

## 2020-01-07 DIAGNOSIS — S43025D Posterior dislocation of left humerus, subsequent encounter: Secondary | ICD-10-CM | POA: Diagnosis not present

## 2021-02-12 IMAGING — CR DG SHOULDER 2+V*L*
1 series · 4 of 4 positions shown · non-contrast
Comparison: None.

CLINICAL DATA: Recent anterior dislocation of the proximal left
humerus, reduced yesterday.

EXAM:
LEFT SHOULDER - 2+ VIEW

[Series 1: dg shoulder left · 0.14mm/px · 4 of 4 slices shown]
[im 1/4]
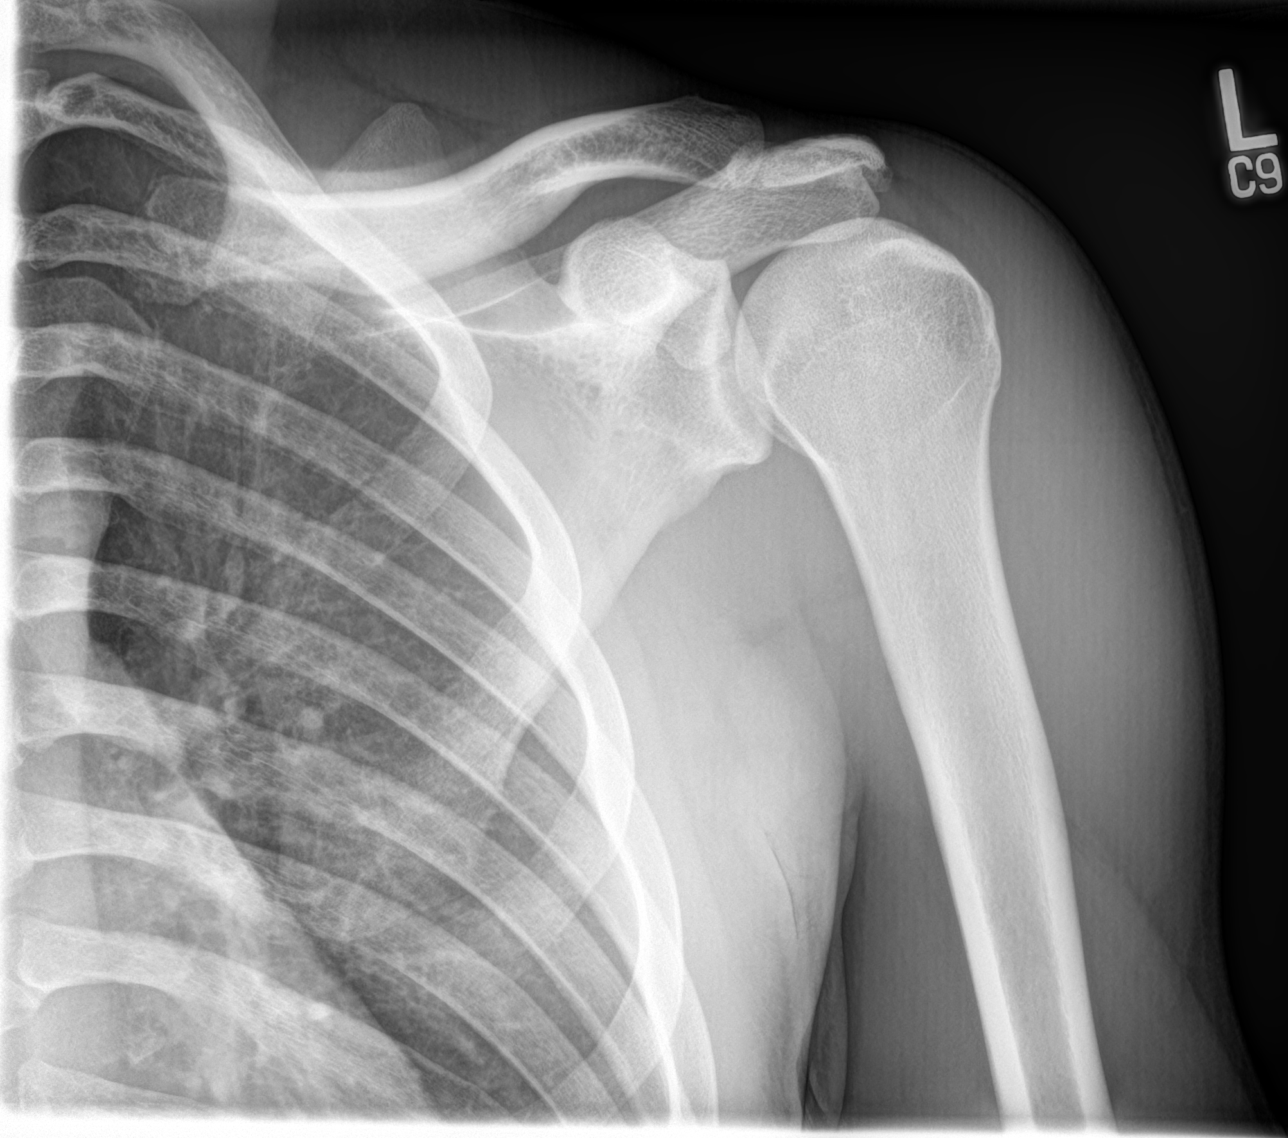
[im 2/4]
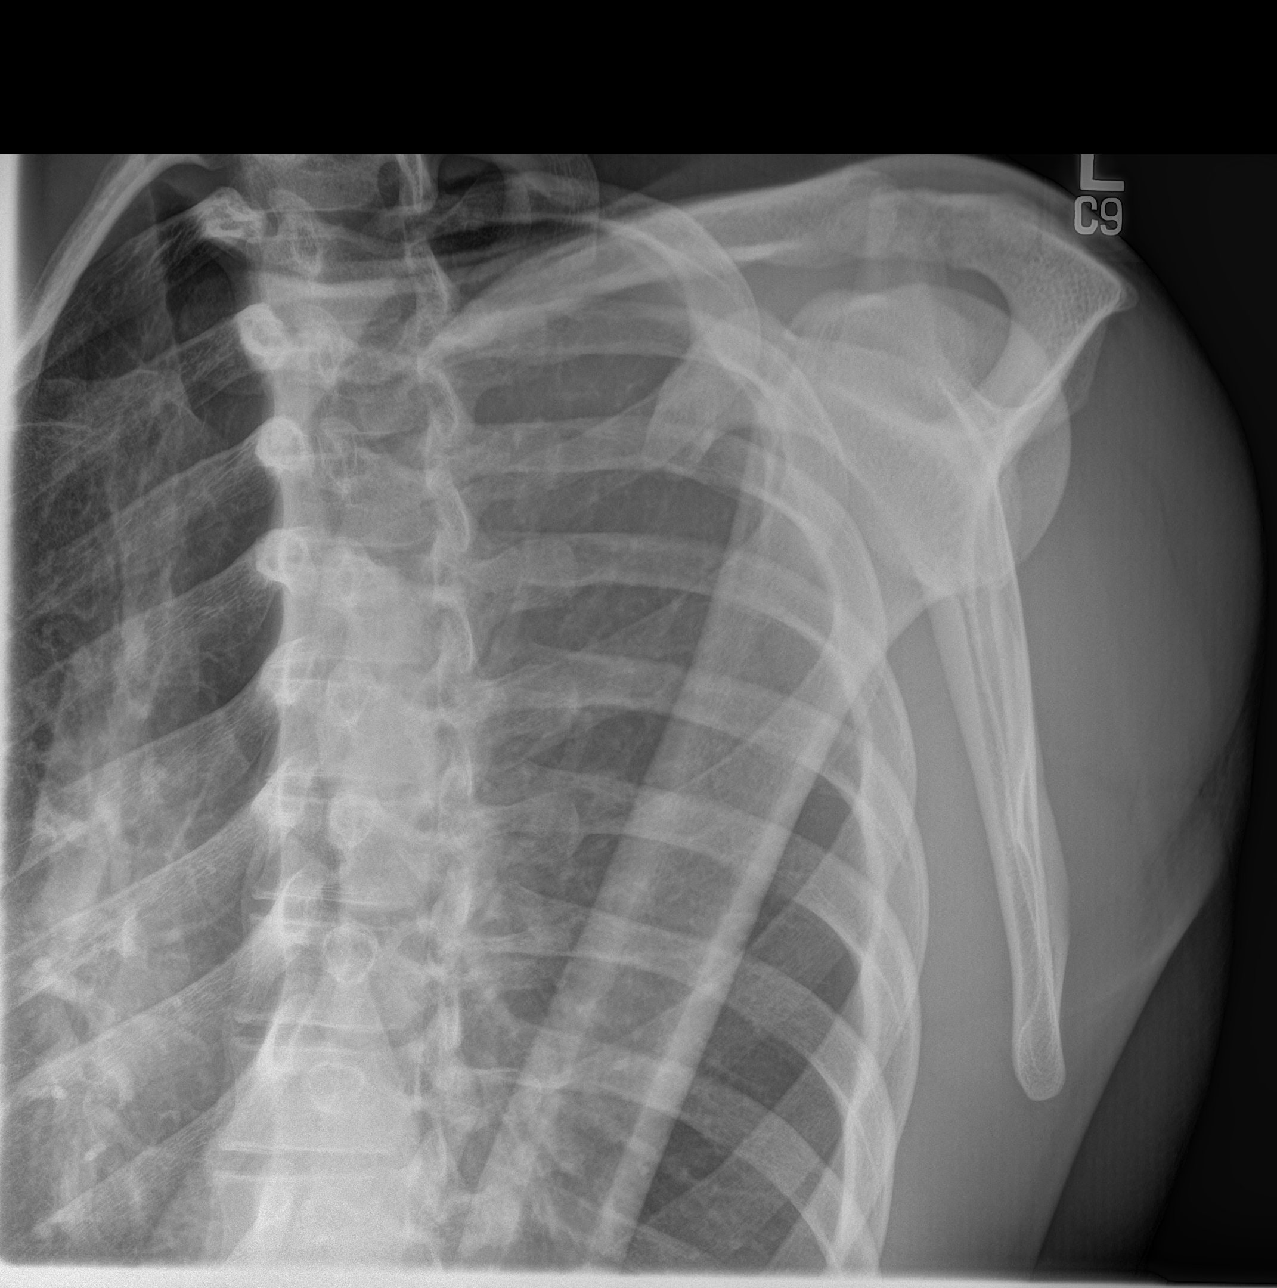
[im 3/4]
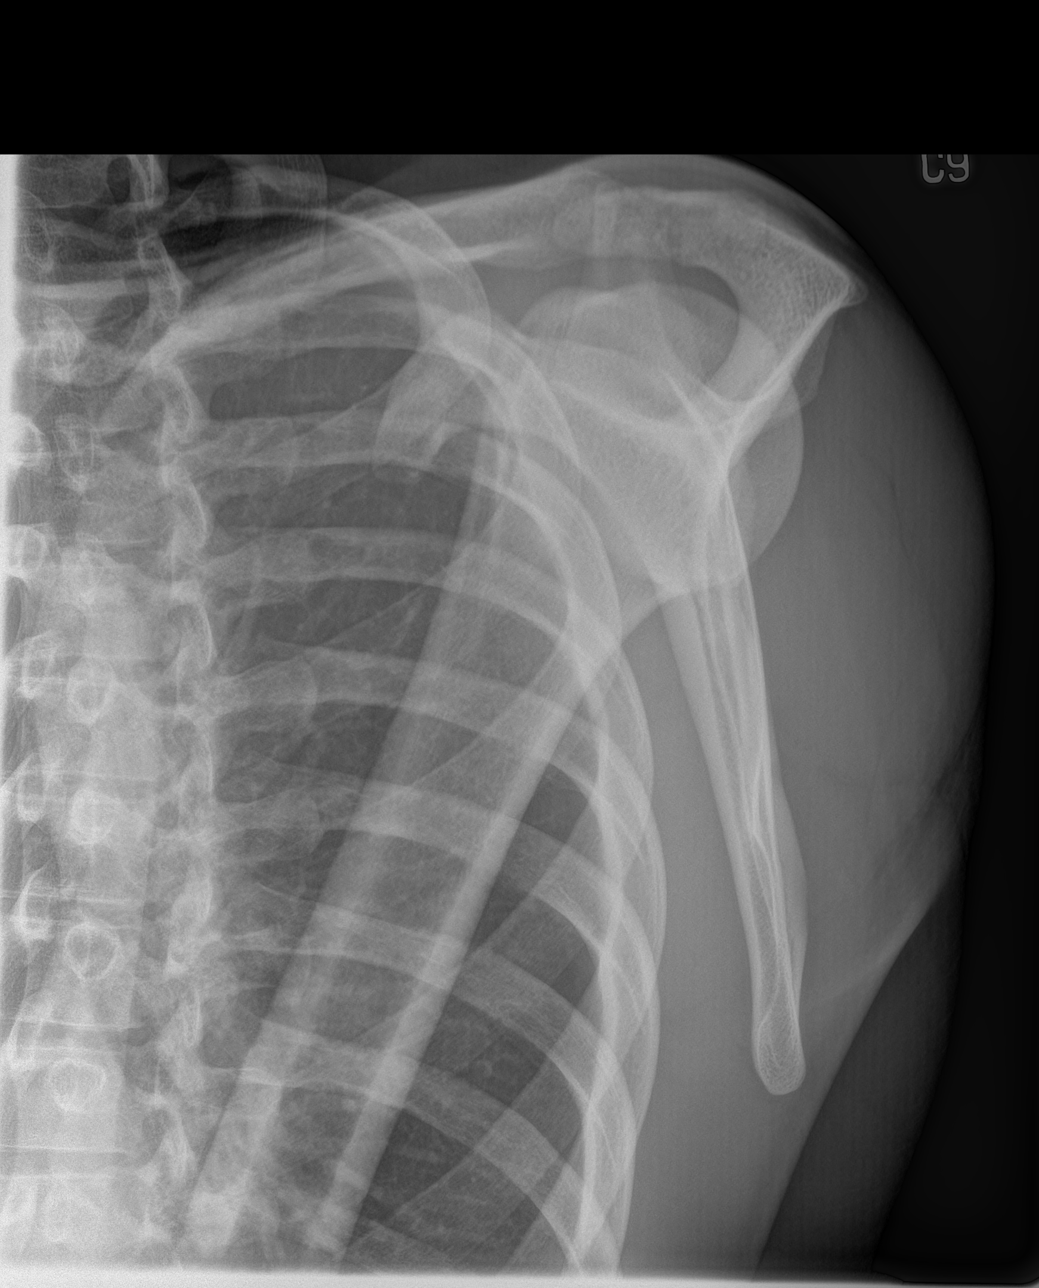
[im 4/4]
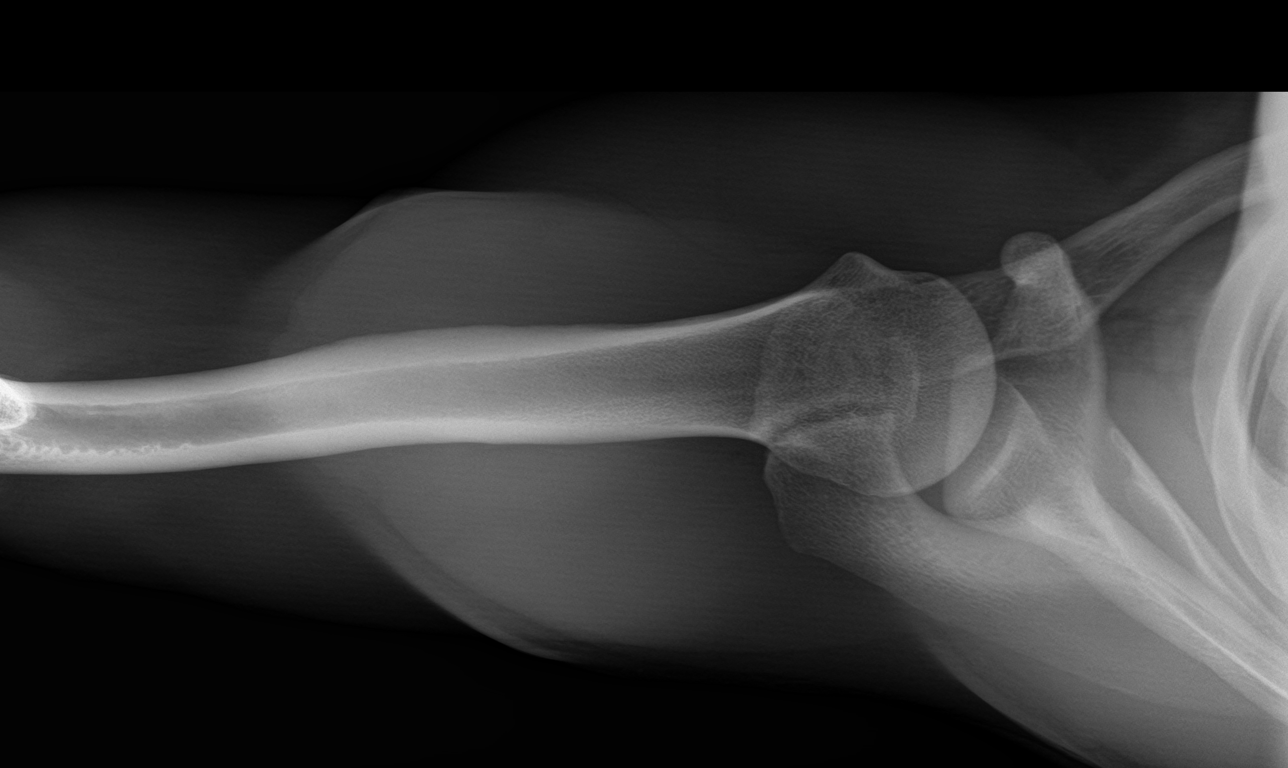

[4 of 4 positions shown; findings below may reference images not displayed]

FINDINGS: There is no evidence of fracture or dislocation. No Hill-Sachs
lesion. Ununited os acromiale.
IMPRESSION: No acute abnormality. No Hill-Sachs lesion or fracture. Ununited os
acromiale which could predispose to impingement.

## 2021-02-14 IMAGING — MR MR SHOULDER*L* W/O CM
4 of 5 series · 31 of 40 positions shown · non-contrast
Comparison: Radiographs 07/08/2019

CLINICAL DATA: Left shoulder dislocation

EXAM:
MRI OF THE LEFT SHOULDER WITHOUT CONTRAST
TECHNIQUE: Multiplanar, multisequence MR imaging of the shoulder was performed.
No intravenous contrast was administered.

[Series 5: PD fat-sat · axial · left · 4.0mm · 0.55mm/px · z∈[-56,+74]mm · 9 of 28 slices shown (1 of 2)]
[im 1/28]
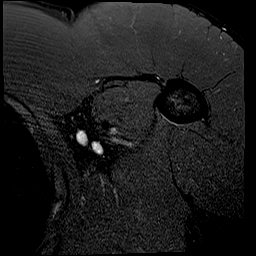
[im 4/28]
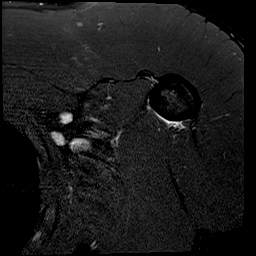
[im 7/28]
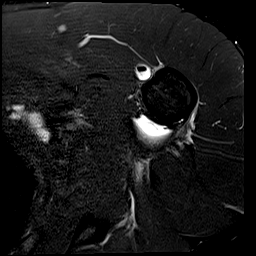
[im 11/28]
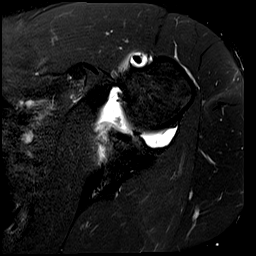
[im 14/28]
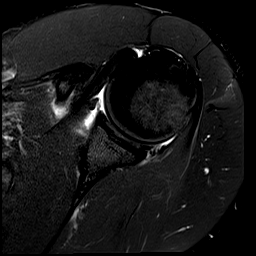
[im 17/28]
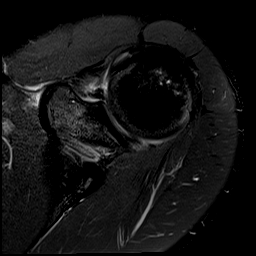
[im 21/28]
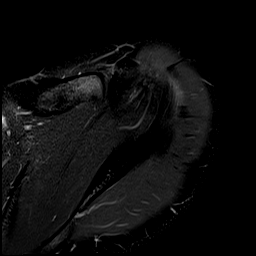
[im 24/28]
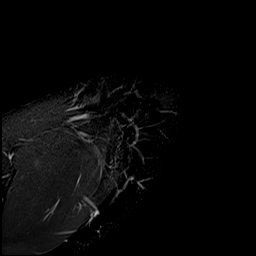
[im 28/28]
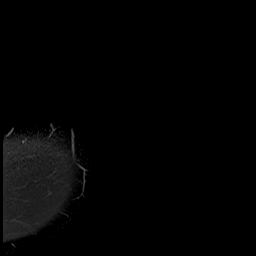

[Series 6: T2 fat-sat · oblique · left · 4.0mm · 0.23mm/px · 7 of 22 slices shown (1 of 2)]
[im 1/22]
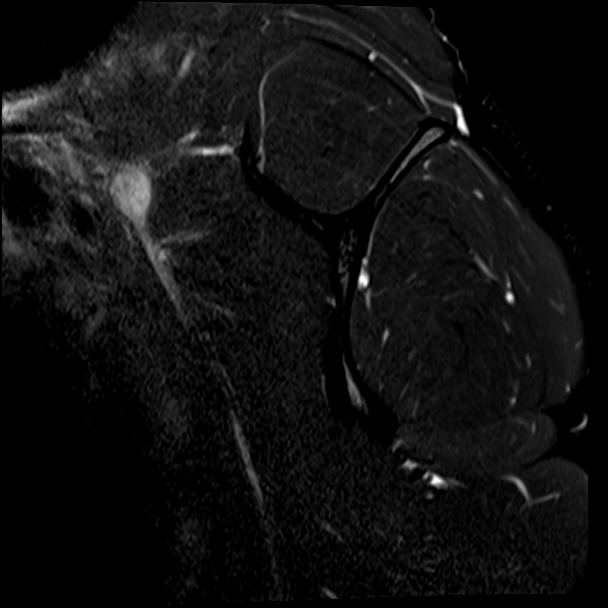
[im 4/22]
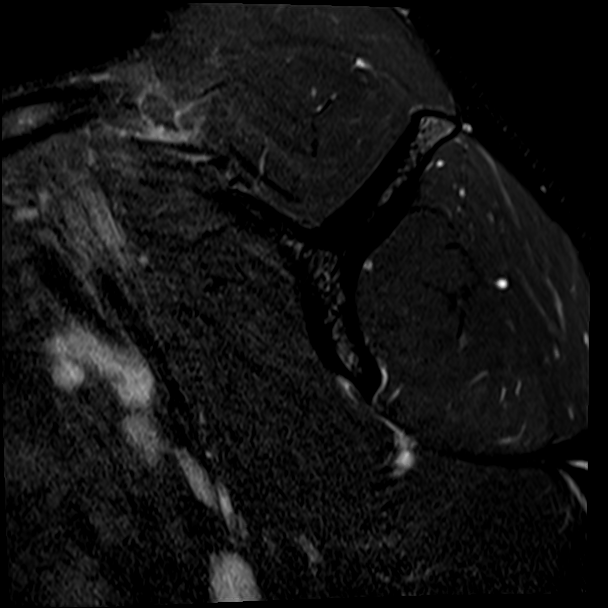
[im 8/22]
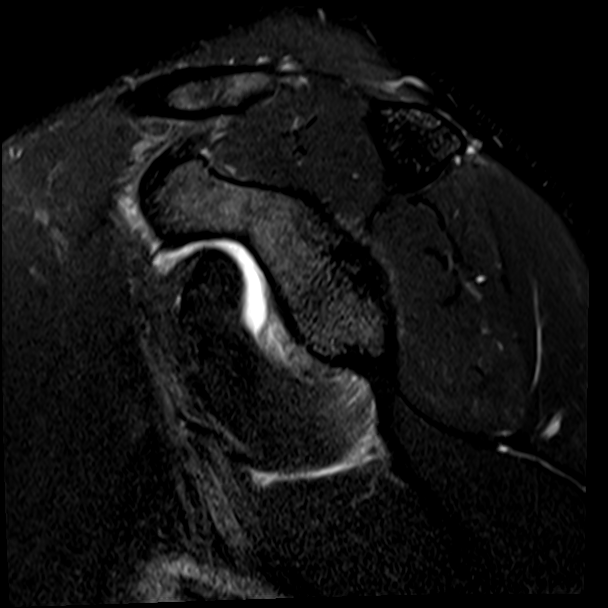
[im 11/22]
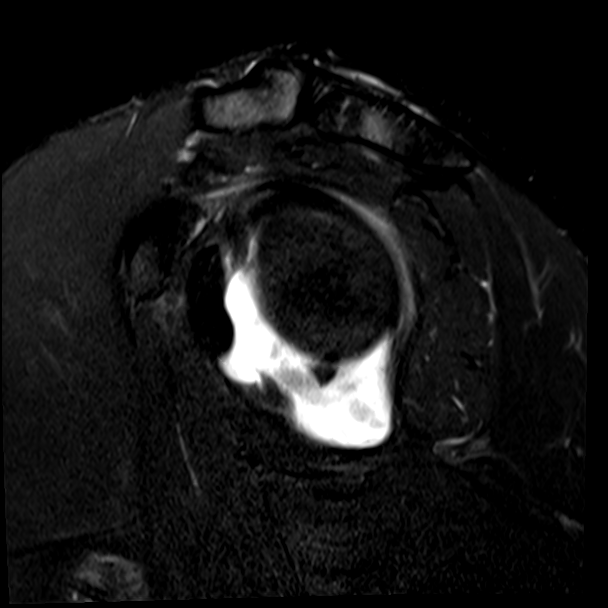
[im 15/22]
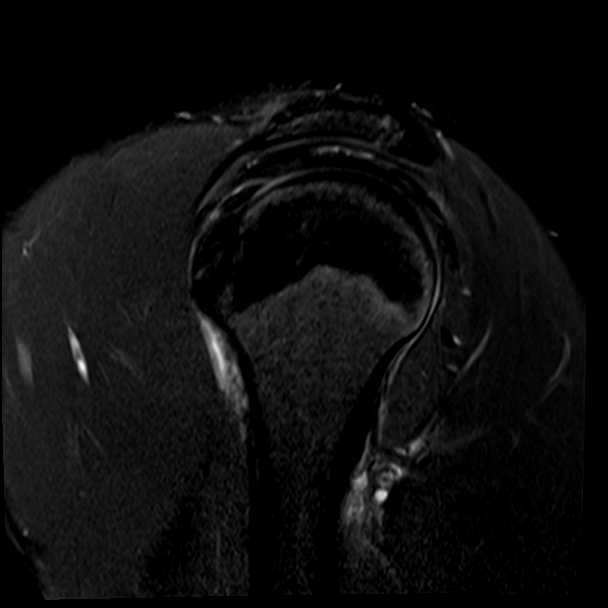
[im 18/22]
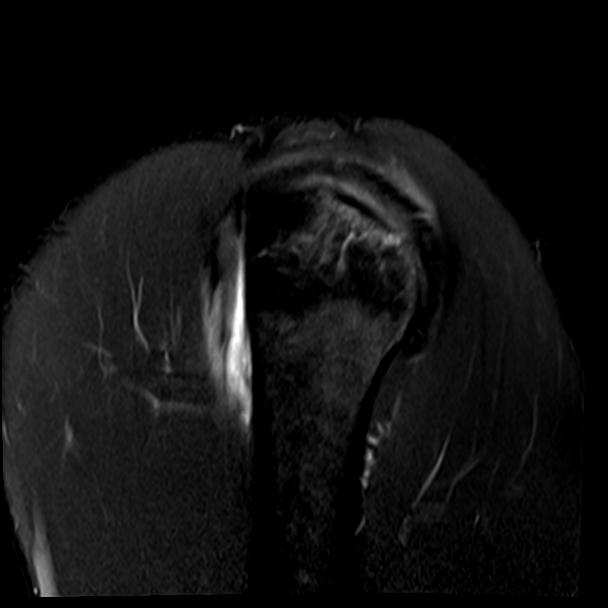
[im 22/22]
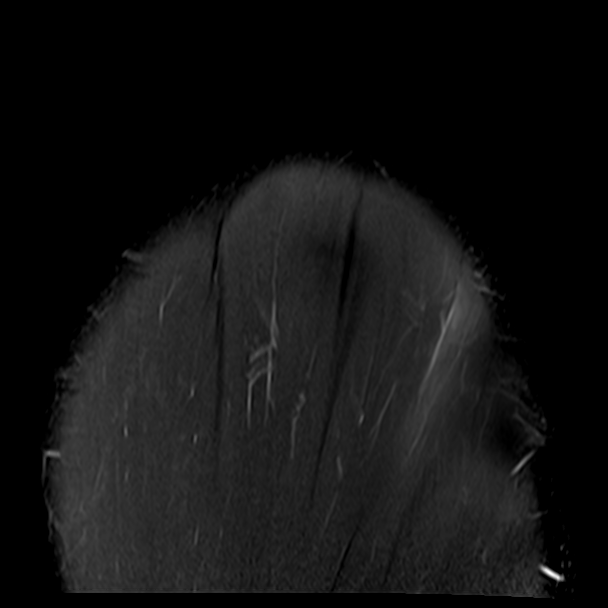

[Series 8: PD fat-sat · oblique · left · 4.0mm · 0.44mm/px · 8 of 22 slices shown (2 of 2)]
[im 1/22]
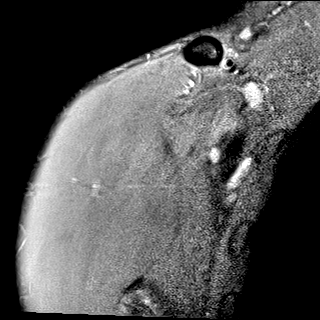
[im 4/22]
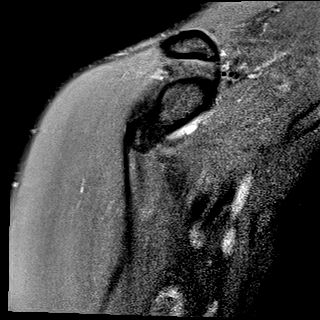
[im 7/22]
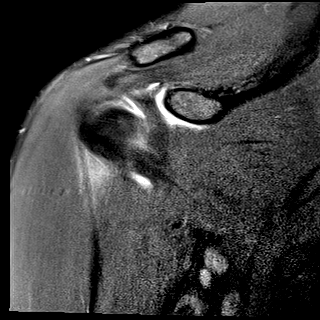
[im 10/22]
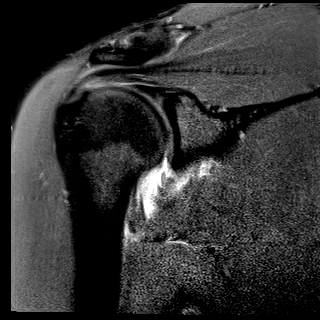
[im 13/22]
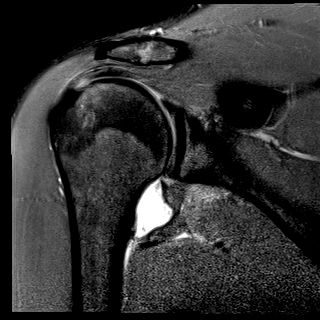
[im 16/22]
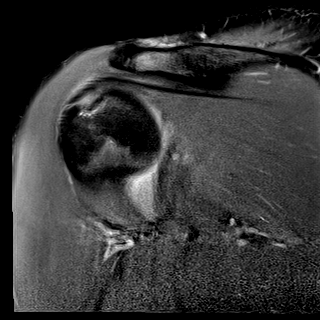
[im 19/22]
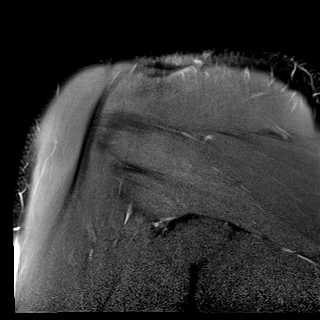
[im 22/22]
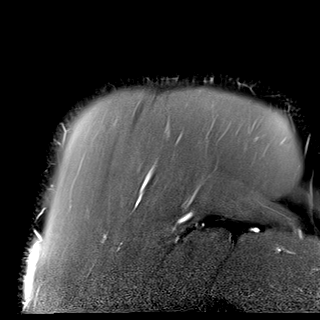

[Series 9: T2 fat-sat · oblique · left · 4.0mm · 0.44mm/px · 7 of 22 slices shown (2 of 2)]
[im 1/22]
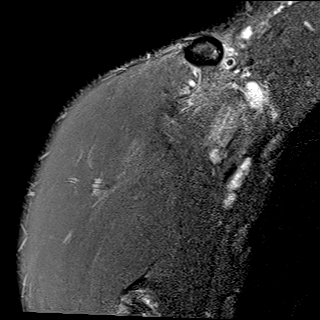
[im 4/22]
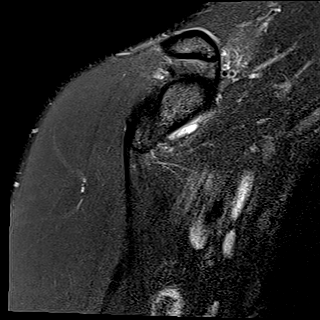
[im 7/22]
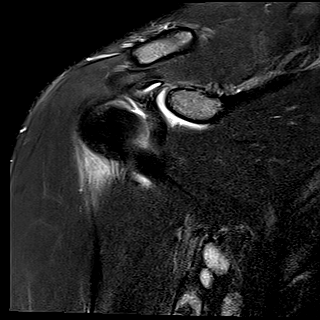
[im 10/22]
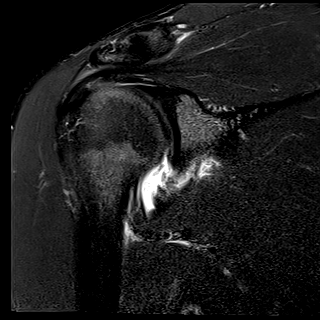
[im 13/22]
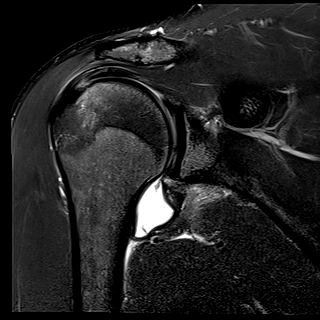
[im 16/22]
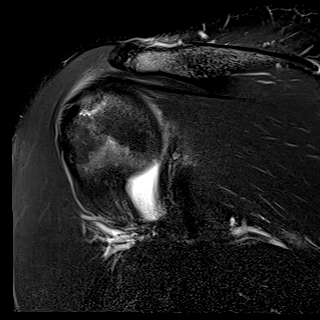
[im 19/22]
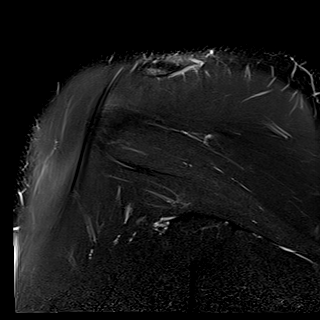

[31 of 40 positions shown; findings below may reference images not displayed]

FINDINGS: Rotator cuff: Mild rotator cuff tendinopathy/tendinosis with small
interstitial tears but no partial or full thickness retracted tear.

Muscles:  Normal

Biceps long head:  Intact

Acromioclavicular Joint: The AC joint is intact. No degenerative
changes. The acromion is type 2 in shape. Os acromial noted. No
lateral downsloping or undersurface spurring changes.

Glenohumeral Joint: Large joint effusion small loose bodies are
noted.

Labrum: Anterior inferior labral tear from the 8 o'clock to the 6
o'clock position. Suspect small loose labral fragments. Possible
loose cartilaginous fragments also. The anterior band of the
inferior glenohumeral ligament is significantly injured near its
glenoid attachment site. Very thick irregular and attenuated and
likely at least partially torn. Patient is a downsloping posterior
glenoid with thick fibrous tissue. Possible Zuzix lesion. The
posterior labrum appears intact and the posterior band of the
inferior glenohumeral ligament is intact. The superior labrum is
intact.

Bones: Could not exclude a small osteochondral injury involving the
inferior aspect of the glenoid. CT may be helpful for further
evaluation.

Hill-Sachs impaction type injury involving the posterosuperior
aspect of the humeral head.

Other: No subacromial/subdeltoid fluid collections to suggest
bursitis.
IMPRESSION: 1. Mild rotator cuff tendinopathy/tendinosis but no partial or
full-thickness tear.
2. Anterior inferior labral tear and significant injury to the
anterior band of the inferior glenohumeral ligament, at least
partially torn at its glenoid attachment site.
3. Possible osteochondral injury involving the inferior glenoid at
the 6 o'clock position. CT may be helpful for further evaluation.
4. Posterior deficiency of the glenoid with thick fibrous tissue,
likely Zuzix lesion.
5. Intact long head biceps tendon and superior labrum.
6. Os acromiale.
